# Patient Record
Sex: Female | Born: 1995 | Hispanic: Yes | Marital: Single | Smoking: Never smoker
Health system: Southern US, Community
[De-identification: ages and names within clinical notes are randomized; demographics above are authoritative.]

## PROBLEM LIST (undated history)

## (undated) ENCOUNTER — Inpatient Hospital Stay: Payer: Self-pay

## (undated) DIAGNOSIS — F419 Anxiety disorder, unspecified: Secondary | ICD-10-CM

## (undated) DIAGNOSIS — F32A Depression, unspecified: Secondary | ICD-10-CM

## (undated) DIAGNOSIS — F329 Major depressive disorder, single episode, unspecified: Secondary | ICD-10-CM

## (undated) DIAGNOSIS — D649 Anemia, unspecified: Secondary | ICD-10-CM

## (undated) DIAGNOSIS — O139 Gestational [pregnancy-induced] hypertension without significant proteinuria, unspecified trimester: Secondary | ICD-10-CM

## (undated) HISTORY — PX: OTHER SURGICAL HISTORY: SHX169

---
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
## 2017-03-19 ENCOUNTER — Other Ambulatory Visit: Payer: Self-pay | Admitting: Advanced Practice Midwife

## 2017-03-19 DIAGNOSIS — Z3481 Encounter for supervision of other normal pregnancy, first trimester: Secondary | ICD-10-CM

## 2017-03-19 LAB — OB RESULTS CONSOLE HIV ANTIBODY (ROUTINE TESTING): HIV: NONREACTIVE

## 2017-03-20 LAB — OB RESULTS CONSOLE HEPATITIS B SURFACE ANTIGEN: Hepatitis B Surface Ag: NEGATIVE

## 2017-03-20 LAB — OB RESULTS CONSOLE RPR: RPR: NONREACTIVE

## 2017-03-21 LAB — OB RESULTS CONSOLE GC/CHLAMYDIA
CHLAMYDIA, DNA PROBE: NEGATIVE
GC PROBE AMP, GENITAL: NEGATIVE

## 2017-03-25 ENCOUNTER — Ambulatory Visit
Admission: RE | Admit: 2017-03-25 | Discharge: 2017-03-25 | Disposition: A | Discharge status: Home / Self Care | Payer: Commercial Managed Care - PPO | Source: Ambulatory Visit | Attending: Advanced Practice Midwife | Admitting: Advanced Practice Midwife

## 2017-03-29 ENCOUNTER — Ambulatory Visit
Admission: RE | Admit: 2017-03-29 | Discharge: 2017-03-29 | Disposition: A | Discharge status: Home / Self Care | Payer: Medicaid Other | Source: Ambulatory Visit | Attending: Advanced Practice Midwife | Admitting: Advanced Practice Midwife

## 2017-03-29 ENCOUNTER — Other Ambulatory Visit: Payer: Self-pay | Admitting: Advanced Practice Midwife

## 2017-03-29 ENCOUNTER — Encounter: Payer: Self-pay | Admitting: Radiology

## 2017-03-29 DIAGNOSIS — Z3481 Encounter for supervision of other normal pregnancy, first trimester: Secondary | ICD-10-CM

## 2017-03-29 DIAGNOSIS — Z3A11 11 weeks gestation of pregnancy: Secondary | ICD-10-CM | POA: Insufficient documentation

## 2017-04-16 NOTE — L&D Delivery Note (Signed)
Date of delivery: 10/19/17 Estimated Date of Delivery: 10/18/17 Patient's last menstrual period was 01/06/2017. EGA: 8447w1d  Delivery Note At 8:13 AM a viable female was delivered via Vaginal, Spontaneous (Presentation: cephalic; direct OA).  APGAR: 7, 8; weight pending.   Placenta status: spontneous, intact.  Cord: 3vv  with the following complications: tight nuchal cord, cut at neck at perineum.  Cord pH: arterial 7.14.  Anesthesia:  epidural Episiotomy: None Lacerations: None Suture Repair: none Est. Blood Loss (mL): 185cc measured  Mom presented to L&D with labor.  epidual placed. Category 2 tracing with variable. Progressed to complete, second stage: short with deep variables delivery of fetal head with restitution to ROA.   Tight and blanched nuchal cord encountered, unable to be loosened.  Two kelly clamps placed and cord cut to facilitate delivery.  Anterior then posterior shoulders delivered without difficulty.  Baby placed on mom's chest, and attended to immediately by peds. Cord gas and cord blood collected.  Placenta spontaneously delivered, intact.   IV pitocin given for hemorrhage prophylaxis. No lacerations.  We sang happy birthday to baby Leoneo (pronounced Leo-now).  Mom to postpartum.  Baby to Couplet care / Skin to Skin.  Bernerd Terhune C Iran Rowe 10/19/2017, 9:37 AM

## 2017-05-23 ENCOUNTER — Other Ambulatory Visit: Payer: Self-pay | Admitting: Physician Assistant

## 2017-05-23 DIAGNOSIS — Z3482 Encounter for supervision of other normal pregnancy, second trimester: Secondary | ICD-10-CM

## 2017-05-31 ENCOUNTER — Ambulatory Visit
Admission: RE | Admit: 2017-05-31 | Discharge: 2017-05-31 | Disposition: A | Discharge status: Home / Self Care | Payer: Self-pay | Source: Ambulatory Visit | Attending: Physician Assistant | Admitting: Physician Assistant

## 2017-05-31 DIAGNOSIS — Z3482 Encounter for supervision of other normal pregnancy, second trimester: Secondary | ICD-10-CM | POA: Insufficient documentation

## 2017-05-31 DIAGNOSIS — Z3A20 20 weeks gestation of pregnancy: Secondary | ICD-10-CM | POA: Insufficient documentation

## 2017-07-19 LAB — HM HIV SCREENING LAB: HM HIV Screening: NEGATIVE

## 2017-07-19 LAB — OB RESULTS CONSOLE HIV ANTIBODY (ROUTINE TESTING): HIV: NONREACTIVE

## 2017-07-20 LAB — OB RESULTS CONSOLE RPR: RPR: NONREACTIVE

## 2017-09-22 LAB — OB RESULTS CONSOLE GBS: GBS: NEGATIVE

## 2017-09-23 LAB — OB RESULTS CONSOLE GC/CHLAMYDIA
CHLAMYDIA, DNA PROBE: NEGATIVE
GC PROBE AMP, GENITAL: NEGATIVE

## 2017-10-18 ENCOUNTER — Other Ambulatory Visit: Payer: Self-pay

## 2017-10-18 ENCOUNTER — Inpatient Hospital Stay
Admission: EM | Admit: 2017-10-18 | Discharge: 2017-10-20 | DRG: 807 | Disposition: A | Discharge status: Home / Self Care | Payer: Medicaid Other | Attending: Obstetrics & Gynecology | Admitting: Obstetrics & Gynecology

## 2017-10-18 DIAGNOSIS — D509 Iron deficiency anemia, unspecified: Secondary | ICD-10-CM | POA: Diagnosis present

## 2017-10-18 DIAGNOSIS — O9902 Anemia complicating childbirth: Principal | ICD-10-CM | POA: Diagnosis present

## 2017-10-18 DIAGNOSIS — Z3A40 40 weeks gestation of pregnancy: Secondary | ICD-10-CM

## 2017-10-18 HISTORY — DX: Major depressive disorder, single episode, unspecified: F32.9

## 2017-10-18 HISTORY — DX: Gestational (pregnancy-induced) hypertension without significant proteinuria, unspecified trimester: O13.9

## 2017-10-18 HISTORY — DX: Anxiety disorder, unspecified: F41.9

## 2017-10-18 HISTORY — DX: Depression, unspecified: F32.A

## 2017-10-18 NOTE — Progress Notes (Signed)
Pt states she was having abdominal pain that was far apart since 11a, as day progressed the pain got closer. Rates pain 9/10 prior to coming to hospital. Says she has been leaking fluid, clear since this morning at 11a, clear. [facility], cervix 1cm. Scheduled to return on next Wed for OB appt. GBS neg.

## 2017-10-19 ENCOUNTER — Inpatient Hospital Stay: Payer: Medicaid Other | Admitting: Anesthesiology

## 2017-10-19 ENCOUNTER — Encounter: Payer: Self-pay | Admitting: Anesthesiology

## 2017-10-19 DIAGNOSIS — Z3A40 40 weeks gestation of pregnancy: Secondary | ICD-10-CM | POA: Diagnosis not present

## 2017-10-19 DIAGNOSIS — D509 Iron deficiency anemia, unspecified: Secondary | ICD-10-CM | POA: Diagnosis present

## 2017-10-19 DIAGNOSIS — Z3483 Encounter for supervision of other normal pregnancy, third trimester: Secondary | ICD-10-CM | POA: Diagnosis present

## 2017-10-19 DIAGNOSIS — O9902 Anemia complicating childbirth: Secondary | ICD-10-CM | POA: Diagnosis present

## 2017-10-19 LAB — TYPE AND SCREEN
ABO/RH(D): O POS
ANTIBODY SCREEN: NEGATIVE

## 2017-10-19 LAB — CBC
HEMATOCRIT: 32.4 % — AB (ref 35.0–47.0)
HEMOGLOBIN: 11.3 g/dL — AB (ref 12.0–16.0)
MCH: 29.2 pg (ref 26.0–34.0)
MCHC: 34.9 g/dL (ref 32.0–36.0)
MCV: 83.8 fL (ref 80.0–100.0)
Platelets: 257 10*3/uL (ref 150–440)
RBC: 3.87 MIL/uL (ref 3.80–5.20)
RDW: 18.2 % — AB (ref 11.5–14.5)
WBC: 10.3 10*3/uL (ref 3.6–11.0)

## 2017-10-19 MED ORDER — IBUPROFEN 600 MG PO TABS
600.0000 mg | ORAL_TABLET | Freq: Four times a day (QID) | ORAL | Status: DC
  Administered 2017-10-19 – 2017-10-20 (×4): 600 mg via ORAL
  Filled 2017-10-19 (×4): qty 1

## 2017-10-19 MED ORDER — ACETAMINOPHEN 500 MG PO TABS
1000.0000 mg | ORAL_TABLET | Freq: Four times a day (QID) | ORAL | Status: DC | PRN
  Administered 2017-10-19: 1000 mg via ORAL

## 2017-10-19 MED ORDER — COCONUT OIL OIL
1.0000 "application " | TOPICAL_OIL | Status: DC | PRN
  Administered 2017-10-19: 1 via TOPICAL
  Filled 2017-10-19: qty 120

## 2017-10-19 MED ORDER — BUTORPHANOL TARTRATE 1 MG/ML IJ SOLN
1.0000 mg | INTRAMUSCULAR | Status: DC | PRN
  Administered 2017-10-19: 1 mg via INTRAVENOUS
  Filled 2017-10-19: qty 1

## 2017-10-19 MED ORDER — PHENYLEPHRINE 40 MCG/ML (10ML) SYRINGE FOR IV PUSH (FOR BLOOD PRESSURE SUPPORT)
80.0000 ug | PREFILLED_SYRINGE | INTRAVENOUS | Status: DC | PRN
  Filled 2017-10-19: qty 5

## 2017-10-19 MED ORDER — OXYTOCIN 40 UNITS IN LACTATED RINGERS INFUSION - SIMPLE MED
2.5000 [IU]/h | INTRAVENOUS | Status: DC
  Filled 2017-10-19: qty 1000

## 2017-10-19 MED ORDER — BUPIVACAINE HCL (PF) 0.25 % IJ SOLN
INTRAMUSCULAR | Status: DC | PRN
  Administered 2017-10-19: 5 mL via EPIDURAL

## 2017-10-19 MED ORDER — LACTATED RINGERS IV SOLN
INTRAVENOUS | Status: DC
  Administered 2017-10-19 (×2): via INTRAVENOUS

## 2017-10-19 MED ORDER — PRENATAL MULTIVITAMIN CH
1.0000 | ORAL_TABLET | Freq: Every day | ORAL | Status: DC
  Administered 2017-10-20: 1 via ORAL
  Filled 2017-10-19: qty 1

## 2017-10-19 MED ORDER — OXYTOCIN BOLUS FROM INFUSION
500.0000 mL | Freq: Once | INTRAVENOUS | Status: AC
  Administered 2017-10-19: 500 mL via INTRAVENOUS

## 2017-10-19 MED ORDER — WITCH HAZEL-GLYCERIN EX PADS: 1.0000 "application " | MEDICATED_PAD | CUTANEOUS | Status: DC

## 2017-10-19 MED ORDER — FENTANYL 2.5 MCG/ML W/ROPIVACAINE 0.15% IN NS 100 ML EPIDURAL (ARMC)
12.0000 mL/h | EPIDURAL | Status: DC
  Administered 2017-10-19: 12 mL/h via EPIDURAL

## 2017-10-19 MED ORDER — SIMETHICONE 80 MG PO CHEW: 80.0000 mg | CHEWABLE_TABLET | ORAL | Status: DC | PRN

## 2017-10-19 MED ORDER — EPHEDRINE 5 MG/ML INJ
10.0000 mg | INTRAVENOUS | Status: DC | PRN
  Filled 2017-10-19: qty 2

## 2017-10-19 MED ORDER — ACETAMINOPHEN 500 MG PO TABS
1000.0000 mg | ORAL_TABLET | Freq: Four times a day (QID) | ORAL | Status: DC | PRN
  Filled 2017-10-19: qty 2

## 2017-10-19 MED ORDER — FENTANYL 2.5 MCG/ML W/ROPIVACAINE 0.15% IN NS 100 ML EPIDURAL (ARMC)
EPIDURAL | Status: AC
  Filled 2017-10-19: qty 100

## 2017-10-19 MED ORDER — DIBUCAINE 1 % RE OINT: 1.0000 "application " | TOPICAL_OINTMENT | RECTAL | Status: DC | PRN

## 2017-10-19 MED ORDER — ONDANSETRON HCL 4 MG/2ML IJ SOLN: 4.0000 mg | Freq: Four times a day (QID) | INTRAMUSCULAR | Status: DC | PRN

## 2017-10-19 MED ORDER — DOCUSATE SODIUM 100 MG PO CAPS
100.0000 mg | ORAL_CAPSULE | Freq: Two times a day (BID) | ORAL | Status: DC
  Administered 2017-10-19 – 2017-10-20 (×2): 100 mg via ORAL
  Filled 2017-10-19 (×2): qty 1

## 2017-10-19 MED ORDER — DIPHENHYDRAMINE HCL 50 MG/ML IJ SOLN: 12.5000 mg | INTRAMUSCULAR | Status: DC | PRN

## 2017-10-19 MED ORDER — LACTATED RINGERS IV SOLN: 500.0000 mL | INTRAVENOUS | Status: DC | PRN

## 2017-10-19 MED ORDER — BENZOCAINE-MENTHOL 20-0.5 % EX AERO: 1.0000 "application " | INHALATION_SPRAY | CUTANEOUS | Status: DC | PRN

## 2017-10-19 MED ORDER — ONDANSETRON HCL 4 MG/2ML IJ SOLN: 4.0000 mg | INTRAMUSCULAR | Status: DC | PRN

## 2017-10-19 MED ORDER — LACTATED RINGERS IV SOLN: 500.0000 mL | Freq: Once | INTRAVENOUS | Status: DC

## 2017-10-19 MED ORDER — LIDOCAINE HCL (PF) 1 % IJ SOLN: 30.0000 mL | INTRAMUSCULAR | Status: DC | PRN

## 2017-10-19 MED ORDER — SOD CITRATE-CITRIC ACID 500-334 MG/5ML PO SOLN: 30.0000 mL | ORAL | Status: DC | PRN

## 2017-10-19 MED ORDER — DIPHENHYDRAMINE HCL 25 MG PO CAPS: 25.0000 mg | ORAL_CAPSULE | Freq: Four times a day (QID) | ORAL | Status: DC | PRN

## 2017-10-19 MED ORDER — ONDANSETRON HCL 4 MG PO TABS: 4.0000 mg | ORAL_TABLET | ORAL | Status: DC | PRN

## 2017-10-19 NOTE — Plan of Care (Signed)
Patient delivered female infant at [id number].

## 2017-10-19 NOTE — Anesthesia Procedure Notes (Signed)
Epidural Patient location during procedure: OB  Staffing Anesthesiologist: Serayah Yazdani, MD Performed: anesthesiologist   Preanesthetic Checklist Completed: patient identified, site marked, surgical consent, pre-op evaluation, timeout performed, IV checked, risks and benefits discussed and monitors and equipment checked  Epidural Patient position: sitting Prep: ChloraPrep Patient monitoring: heart rate, continuous pulse ox and blood pressure Approach: midline Location: L4-L5 Injection technique: LOR saline  Needle:  Needle type: Tuohy  Needle gauge: 18 G Needle length: 9 cm and 9 Catheter type: closed end flexible Catheter size: 20 Guage Test dose: negative and 1.5% lidocaine with Epi 1:200 K  Assessment Sensory level: T10 Events: blood not aspirated, injection not painful, no injection resistance, negative IV test and no paresthesia  Additional Notes   Patient tolerated the insertion well without complications.Reason for block:procedure for pain     

## 2017-10-19 NOTE — H&P (Signed)
OB History & Physical   History of Present Illness:  Chief Complaint:   HPI:  Julie Watkins is a 22 y.o. G36P0102 female at [redacted]w[redacted]d dated by  Patient's last menstrual period was 01/06/2017. Estimated Date of Delivery: 10/18/17.  Dates were adjusted based on 11w [place] by 2d.    She presents to L&D with contractions, found to be 3/60/-2, and after a period of observation in triage made cervical change to 4/70/-2.     +FM, + CTX, no LOF, no VB  Pregnancy Issues: 1. History of 34wk induction for mono-di twins with IUGR 2. Close interval pregnancy (<10mos) 3. Iron Def. Anemia  4. History of self mutilation (cutter) 5. History of depression/anxiety  6. Family history of Down syndrome (sister and aunt) 7. History of postpartum severe preeclampsia following delivery    Maternal Medical History:   Past Medical History:  Diagnosis Date  . Anemia   . Anxiety   . Depression   . Pregnancy induced hypertension     Past Surgical History:  Procedure Laterality Date  . none      Allergies  Allergen Reactions  . Shrimp [Shellfish Allergy] Swelling    Prior to Admission medications   Medication Sig Start Date End Date Taking? Authorizing Provider  acetaminophen (TYLENOL) 325 MG tablet Take 2 tablets (650 mg total) by mouth every 4 (four) hours as needed (for pain scale < 4). 08/12/16  Yes Verdis Koval, [name], MD  ferrous sulfate 325 (65 FE) MG tablet Take 325 mg by mouth daily with breakfast.   Yes [provider]  Prenatal Vit-Fe Fumarate-FA (PRENATAL MULTIVITAMIN) TABS tablet Take 1 tablet by mouth daily at 12 noon.   Yes [provider]  ibuprofen (ADVIL,MOTRIN) 600 MG tablet Take 1 tablet (600 mg total) by mouth every 6 (six) hours as needed. Patient not taking: Reported on 10/18/2017 08/12/16   Bayler Nehring, [name], MD     Prenatal care site: [facility]   Social History: She  reports that she has never smoked. She has never used smokeless tobacco. She  reports that she does not drink alcohol or use drugs.  Family History: family history includes Down syndrome in her sister.   Review of Systems: A full review of systems was performed and negative except as noted in the HPI.     Physical Exam:  Vital Signs: BP 118/68   Pulse 95   Temp 98.4 F (36.9 C) (Oral)   Resp 20   Ht 5\' 5"  (1.651 m)   Wt 74.4 kg (164 lb)   LMP 01/06/2017   SpO2 100%   BMI 27.29 kg/m  General: no acute distress.  HEENT: normocephalic, atraumatic Heart: regular rate & rhythm.  No murmurs/rubs/gallops Lungs: clear to auscultation bilaterally, normal respiratory effort Abdomen: soft, gravid, non-tender;  EFW: 6.12 Pelvic:   External: Normal external female genitalia  Cervix: 4/70/-2  Extremities: non-tender, symmetric, mild edema bilaterally.  DTRs: 2+  Neurologic: Alert & oriented x 3.    Results for orders placed or performed during the hospital encounter of 10/18/17 (from the past 24 hour(s))  CBC     Status: Abnormal   Collection Time: 10/19/17  2:38 AM  Result Value Ref Range   WBC 10.3 3.6 - 11.0 K/uL   RBC 3.87 3.80 - 5.20 MIL/uL   Hemoglobin 11.3 (L) 12.0 - 16.0 g/dL   HCT 16.1 (L) 09.6 - 04.5 %   MCV 83.8 80.0 - 100.0 fL   MCH  29.2 26.0 - 34.0 pg   MCHC 34.9 32.0 - 36.0 g/dL   RDW 40.918.2 (H) 81.111.5 - 91.414.5 %   Platelets 257 150 - 440 K/uL  Type and screen [facility]     Status: None   Collection Time: 10/19/17  2:38 AM  Result Value Ref Range   ABO/RH(D) O POS    Antibody Screen NEG    Sample Expiration      10/22/2017 Performed at [facility], [address] [place], [place] [address]     Pertinent Results:  Prenatal Labs: Blood type/Rh O+  Antibody screen neg  Rubella Immune  Varicella Immune  RPR NR  HBsAg Neg  HIV NR  GC neg  Chlamydia neg  Genetic screening negative  1 hour GTT 62  3 hour GTT   GBS neg   FHT: 135 mod + accels no decels TOCO:q8 min SVE:  4/70/-2 per RN Cephalic by  leopolds   Assessment:  Julie Watkins is a 22 y.o. 312P0102 female at 5873w1d with labor   Plan:  1. Admit to Labor & Delivery 2. CBC, T&S, Clrs, IVF 3. GBS  neg 4. Consents obtained. 5. Continuous efm/toco 6. Expectant management 7. Epidural ok-platelets ok 8. Watch BPs- has hx of PIH.    ----- Ranae Plumberhelsea Tyrees Chopin, MD Attending Obstetrician and Gynecologist [facility], Department of OB/GYN [facility]

## 2017-10-19 NOTE — Progress Notes (Signed)
Intrapartum progress note:  S: comfortable after epidural  O: BP 118/68   Pulse 95   Temp 98.4 F (36.9 C) (Oral)   Resp 20   Ht 5\' 5"  (1.651 m)   Wt 74.4 kg (164 lb)   LMP 01/06/2017   SpO2 100%   BMI 27.29 kg/m   SVE: 8cm then 7cm after AROM for clear fluid TOCO: q2-3  FHT: 130 mod +accels +occasional variables  A/P: 22yo G2P1002 @ 40+1 with labor  1. IUP: category 2, reassuring with mod variability and accelerations.   2. Labor: proceeding with expectant management after AROM.  anticipate vaginal delivery.    ----- Ranae Plumberhelsea Ward, MD Attending Obstetrician and Gynecologist [facility], Department of OB/GYN [facility]

## 2017-10-19 NOTE — Progress Notes (Signed)
RN in room to see pt and provide water to po hydrate. Pt back in bed from restroom, lying on Lt side with lights in room dimmed, agrees contractions have spaced out and rates pain at present 4/10. Pt denies needing any medication for pain at this time.

## 2017-10-19 NOTE — Progress Notes (Signed)
Dr Elesa MassedWard at bedside to evaluate pt and explained plan for AROM.

## 2017-10-19 NOTE — Progress Notes (Signed)
Repeat SVE completed, cervix now 4cm. Pt reports worsening pain with contractions although still spaced out. FOB, Simon[place]Leo [place].

## 2017-10-19 NOTE — Anesthesia Preprocedure Evaluation (Signed)
Anesthesia Evaluation  Patient identified by MRN, date of birth, ID band Patient awake    Reviewed: Allergy & Precautions, NPO status , Patient's Chart, lab work & pertinent test results, reviewed documented beta blocker date and time   Airway Mallampati: II  TM Distance: >3 FB     Dental  (+) Chipped   Pulmonary           Cardiovascular      Neuro/Psych PSYCHIATRIC DISORDERS Depression    GI/Hepatic   Endo/Other    Renal/GU      Musculoskeletal   Abdominal   Peds  Hematology  (+) anemia ,   Anesthesia Other Findings   Reproductive/Obstetrics                             Anesthesia Physical Anesthesia Plan  ASA: II  Anesthesia Plan: Epidural   Post-op Pain Management:    Induction:   PONV Risk Score and Plan:   Airway Management Planned:   Additional Equipment:   Intra-op Plan:   Post-operative Plan:   Informed Consent: I have reviewed the patients History and Physical, chart, labs and discussed the procedure including the risks, benefits and alternatives for the proposed anesthesia with the patient or authorized representative who has indicated his/her understanding and acceptance.     Plan Discussed with: CRNA  Anesthesia Plan Comments:         Anesthesia Quick Evaluation

## 2017-10-19 NOTE — Lactation Note (Addendum)
This note was copied from a baby's chart. Lactation Consultation Note  Patient Name: Julie Watkins ZOXWR'UToday's Date: 10/19/2017 Reason for consult: Term Mom was having difficulty waking Julie Watkins to breast feed.  Demonstrated waking techniques.  Changed meconium.  Mom concerned about having enough milk and asking about pumping.  Demonstrated hand expression of lots of colostrum.  Discouraged pumping and bottle feeding explaining risks to milk supply and continued successful breast feeding.  Once awake and he was positioned in cradle hold skin to skin with pillow support, he began strong rhythmic sucking with occasional swallows.  Mom reports being unable to breast feed twins before d/t starting out in NICU on bottles.  Kept trying for a month without success.  Reviewed supply and demand, routine newborn feeding patterns and normal course of lactation.  Lactation name and number written on white board and encouraged to call with any questions, concerns or assistance.  Maternal Data Formula Feeding for Exclusion: No Has patient been taught Hand Expression?: Yes Does the patient have breastfeeding experience prior to this delivery?: Yes  Feeding Feeding Type: Breast Fed Length of feed: 20 min  LATCH Score Latch: Repeated attempts needed to sustain latch, nipple held in mouth throughout feeding, stimulation needed to elicit sucking reflex.  Audible Swallowing: A few with stimulation  Type of Nipple: Everted at rest and after stimulation  Comfort (Breast/Nipple): Soft / non-tender  Hold (Positioning): Assistance needed to correctly position infant at breast and maintain latch.  LATCH Score: 7  Interventions Interventions: Breast feeding basics reviewed;Reverse pressure;Assisted with latch;Breast compression;Adjust position;Breast massage;Support pillows;Hand express;Position options  Lactation Tools Discussed/Used     Consult Status Consult Status: Follow-up Date: 10/19/17 Follow-up  type: Call as needed    Julie MeckelWilliams, Ajmal Kathan Kay 10/19/2017, 2:47 PM

## 2017-10-19 NOTE — Discharge Summary (Signed)
Obstetrical Discharge Summary  Patient Name: Julie Watkins DOB: 03/21/1996 MRN: [id number]  Date of Admission: 10/18/2017 Date of Delivery: 10/19/17 Delivered by: Ranae Plumberhelsea Chet Greenley, MD Date of Discharge: 10/20/2017  Primary OB: Phineas Realharles Drew NWG:[id number]:Patient's last menstrual period was 01/06/2017. EDC Estimated Date of Delivery: 10/18/17 Gestational Age at Delivery: 5348w1d   Antepartum complications:   1. History of 34wk induction for mono-di twins with IUGR 2. Close interval pregnancy (<7018mos) 3. Iron Def. Anemia  4. History of self mutilation (cutter) 5. History of depression/anxiety  6. Family history of Down syndrome (sister and aunt) 7. History of postpartum severe preeclampsia following delivery    Admitting Diagnosis:  Labor at term Secondary Diagnosis: Patient Active Problem List   Diagnosis Date Noted  . Twin gestation, monochorionic/diamniotic (one placenta, two amniotic sacs) 08/09/2016  . Indication for care in labor and delivery, antepartum 08/05/2016  . Preterm labor 08/04/2016  . Labor and delivery indication for care or intervention 08/03/2016  . Twin pregnancy, antepartum mono di  03/15/2016  . Family history of Down syndrome 03/15/2016  . High risk teen pregnancy 03/15/2016  . Knee injury, sequela 03/15/2016  . MDD (major depressive disorder), single episode, severe (HCC) 09/23/2013  . Suicidal ideation 09/23/2013  . Overdose of analgesic or antipyretic 09/23/2013    Augmentation: AROM Complications: None Intrapartum complications/course: Mom presented to L&D with labor.  epidual placed. Category 2 tracing with variable. Progressed to complete, second stage: short with deep variables delivery of fetal head with restitution to ROA.   Tight and blanched nuchal cord encountered, unable to be loosened.  Two kelly clamps placed and cord cut to facilitate delivery.  Anterior then posterior shoulders delivered without difficulty.  Baby placed on mom's chest, and attended to  immediately by peds. Cord gas and cord blood collected.  Placenta spontaneously delivered, intact.   IV pitocin given for hemorrhage prophylaxis. No lacerations.  Date of Delivery: 10/19/17 Delivered By: Leeroy Bockhelsea Jethro Radke Delivery Type: spontaneous vaginal delivery Anesthesia: epidural Placenta: sponatneous Laceration: none Episiotomy: none Newborn Data: Live born female "[place]" Birth Weight:  3420g, 7lb 8oz APGAR: 7, 8  Newborn Delivery   Birth date/time:  10/19/2017 08:13:00 Delivery type:  Vaginal, Spontaneous     Postpartum Procedures: none  Post partum course:  Patient had an uncomplicated postpartum course.  By time of discharge on PPD#1, her pain was controlled on oral pain medications; she had appropriate lochia and was ambulating, voiding without difficulty and tolerating regular diet.  She was deemed stable for discharge to home.    Discharge Physical Exam:  BP 111/63 (BP Location: Right Arm)   Pulse 62   Temp 97.8 F (36.6 C) (Oral)   Resp 20   Ht 5\' 5"  (1.651 m)   Wt 74.4 kg (164 lb)   LMP 01/06/2017   SpO2 100%   Breastfeeding? Unknown   BMI 27.29 kg/m   General: NAD CV: RRR Pulm: CTABL, nl effort ABD: s/nd/nt, fundus firm and below the umbilicus Lochia: moderate DVT Evaluation: LE non-ttp, no evidence of DVT on exam.  Hemoglobin  Date Value Ref Range Status  10/20/2017 9.7 (L) 12.0 - 16.0 g/dL Final   HGB  Date Value Ref Range Status  09/21/2013 11.0 (L) 12.0 - 16.0 g/dL Final   HCT  Date Value Ref Range Status  10/20/2017 29.5 (L) 35.0 - 47.0 % Final  09/21/2013 36.1 35.0 - 47.0 % Final     Disposition: stable, discharge to home. Baby Feeding: breastmilk Baby Disposition: home with mom  Rh Immune globulin given: n/a Rubella vaccine given: n/a Tdap vaccine given in AP or PP setting: AP Flu vaccine given in AP or PP setting: AP  Contraception: depo then nexplanon  Prenatal Labs:   Blood type/Rh O+  Antibody screen neg  Rubella Immune   Varicella Immune  RPR NR  HBsAg Neg  HIV NR  GC neg  Chlamydia neg  Genetic screening negative  1 hour GTT 62  3 hour GTT   GBS neg       Plan:  Gwendlyn Hanback was discharged to home in good condition. Follow-up appointment with Dr. Elesa Massed in 6 weeks.  Discharge Medications: Allergies as of 10/20/2017      Reactions   Shrimp [shellfish Allergy] Swelling      Medication List    TAKE these medications   acetaminophen 325 MG tablet Commonly known as:  TYLENOL Take 2 tablets (650 mg total) by mouth every 4 (four) hours as needed (for pain scale < 4).   ferrous sulfate 325 (65 FE) MG tablet Take 325 mg by mouth daily with breakfast.   ibuprofen 600 MG tablet Commonly known as:  ADVIL,MOTRIN Take 1 tablet (600 mg total) by mouth every 6 (six) hours as needed.   prenatal multivitamin Tabs tablet Take 1 tablet by mouth daily at 12 noon.       Follow-up Information    Gittel Mccamish, [name], MD Follow up in 6 week(s).   Specialty:  Obstetrics and Gynecology Why:  4-6 weeks for routine postpartum check Contact information: [address] [place] [address] [phone]           Signed: ----- Ranae Plumber, MD Attending Obstetrician and Gynecologist [facility], Department of OB/GYN [facility]

## 2017-10-19 NOTE — Plan of Care (Signed)
Labor progression noted, pt now 9.5cm, bloody show, deep variable during contractions. O2 via non-rebreather on at 10L. Pt being repositioned often to improve tracing. Dr Elesa MassedWard provided update.

## 2017-10-20 LAB — CBC
HCT: 29.5 % — ABNORMAL LOW (ref 35.0–47.0)
HEMOGLOBIN: 9.7 g/dL — AB (ref 12.0–16.0)
MCH: 27.8 pg (ref 26.0–34.0)
MCHC: 32.8 g/dL (ref 32.0–36.0)
MCV: 84.7 fL (ref 80.0–100.0)
Platelets: 230 10*3/uL (ref 150–440)
RBC: 3.49 MIL/uL — AB (ref 3.80–5.20)
RDW: 18.9 % — ABNORMAL HIGH (ref 11.5–14.5)
WBC: 9.5 10*3/uL (ref 3.6–11.0)

## 2017-10-20 LAB — RPR: RPR Ser Ql: NONREACTIVE

## 2017-10-20 NOTE — Discharge Instructions (Signed)

## 2017-10-20 NOTE — Progress Notes (Signed)
Provided and reviewed discharge paperwork and prescriptions. Pt verbalized understanding. Pt to make follow up appointment within 4-6 weeks. Discharged home with infant via wheelchair to visitor entrance by hospital volunteer.

## 2017-10-20 NOTE — Anesthesia Postprocedure Evaluation (Signed)
Anesthesia Post Note  Patient: Julie Watkins  Procedure(s) Performed: AN AD HOC LABOR EPIDURAL  Patient location during evaluation: Mother Baby Anesthesia Type: Epidural Level of consciousness: awake and alert Pain management: pain level controlled Vital Signs Assessment: post-procedure vital signs reviewed and stable Respiratory status: spontaneous breathing, nonlabored ventilation and respiratory function stable Cardiovascular status: stable Postop Assessment: no headache, no backache, patient able to bend at knees and able to ambulate Anesthetic complications: no     Last Vitals:  Vitals:   10/20/17 0343 10/20/17 0740  BP: (!) 106/45 111/63  Pulse: 66 62  Resp: 18 20  Temp: 36.7 C 36.6 C  SpO2: 98% 100%    Last Pain:  Vitals:   10/20/17 0740  TempSrc: Oral  PainSc:                  Cleda MccreedyJoseph K Krew Hortman

## 2018-10-24 DIAGNOSIS — Z7289 Other problems related to lifestyle: Secondary | ICD-10-CM | POA: Insufficient documentation

## 2018-10-27 ENCOUNTER — Ambulatory Visit: Payer: Self-pay

## 2018-11-07 ENCOUNTER — Other Ambulatory Visit: Payer: Self-pay

## 2018-11-07 ENCOUNTER — Ambulatory Visit (LOCAL_COMMUNITY_HEALTH_CENTER): Payer: Self-pay

## 2018-11-07 VITALS — BP 105/72 | Ht 62.0 in | Wt 153.5 lb

## 2018-11-07 DIAGNOSIS — Z3042 Encounter for surveillance of injectable contraceptive: Secondary | ICD-10-CM

## 2018-11-07 DIAGNOSIS — Z3009 Encounter for other general counseling and advice on contraception: Secondary | ICD-10-CM

## 2018-11-07 DIAGNOSIS — Z30013 Encounter for initial prescription of injectable contraceptive: Secondary | ICD-10-CM

## 2018-11-07 MED ORDER — MULTI-VITAMIN/MINERALS PO TABS: 1.0000 | ORAL_TABLET | Freq: Every day | ORAL | 0 refills | Status: DC

## 2018-11-07 MED ORDER — MEDROXYPROGESTERONE ACETATE 150 MG/ML IM SUSP
150.0000 mg | Freq: Once | INTRAMUSCULAR | Status: AC
  Administered 2018-11-07: 150 mg via INTRAMUSCULAR

## 2018-11-07 NOTE — Progress Notes (Signed)
Client is 14 weeks and 1 day post last Depo injection on 07/31/2018. Intercourse without condom yesterday and took Plan B this am. Depo administered today without difficulty per 12/06/2017 written order of Jerline Pain FNP-BC. Client tolerated injection without difficulty. Folic acid counseling completed. Shona Needles, RN

## 2019-04-11 IMAGING — US US MFM FETAL BPP W/O NON-STRESS
1 series · 13 of 20 positions shown · non-contrast
Comparison: none

PATIENT INFO:

PERFORMED BY:
SERVICE(S) PROVIDED:
INDICATIONS:
32 weeks gestation of pregnancy
Twin gestation
Monodi
Fetal growth restriction
FETAL EVALUATION (FETUS A):
Num Of Fetuses:     2
Fetal Heart         153
Rate(bpm):
Cardiac Activity:   Present
Presentation:       Vertex
Placenta:           Anterior
Membrane Desc:      Monochorionic - diamniotic
Membrane Size:      Normal
Largest Pocket(cm)
2.0
BIOPHYSICAL EVALUATION (FETUS A):
Amniotic F.V:   Pocket => 2 cm two         F. Tone:         Observed
planes
F. Movement:    Observed                   Score:           [DATE]
F. Breathing:   Observed
GESTATIONAL AGE (FETUS A):
LMP:           32w 2d        Date:  12/13/15                 EDD:   09/18/16
Best:          32w 2d     Det. By:  LMP  (12/13/15)          EDD:   09/18/16
ANATOMY (FETUS A):
Stomach:               Seen                   Bladder:                Seen
FETAL EVALUATION (FETUS B):
Fetal Heart         149
Presentation:       Breech /oblique
6.9
BIOPHYSICAL EVALUATION (FETUS B):
GESTATIONAL AGE (FETUS B):
ANATOMY (FETUS B):

[Series 1: us mfm fetal bpp w/o non-stress · 0.26mm/px · 13 of 20 slices shown]
[im 1/20]
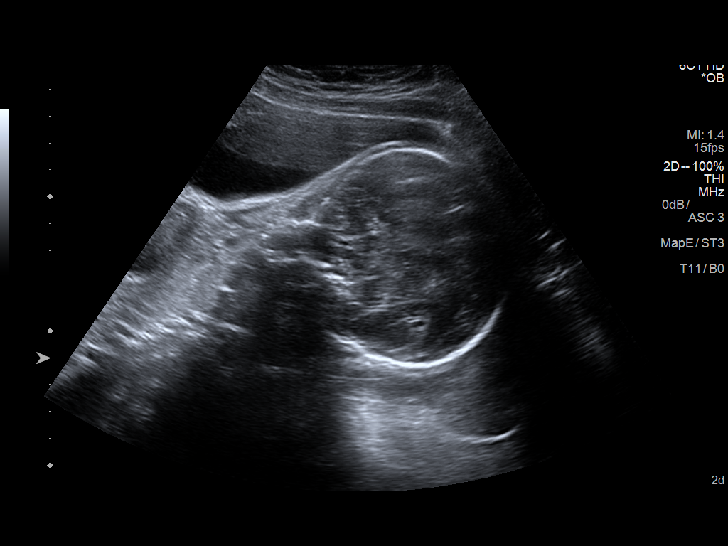
[im 3/20]
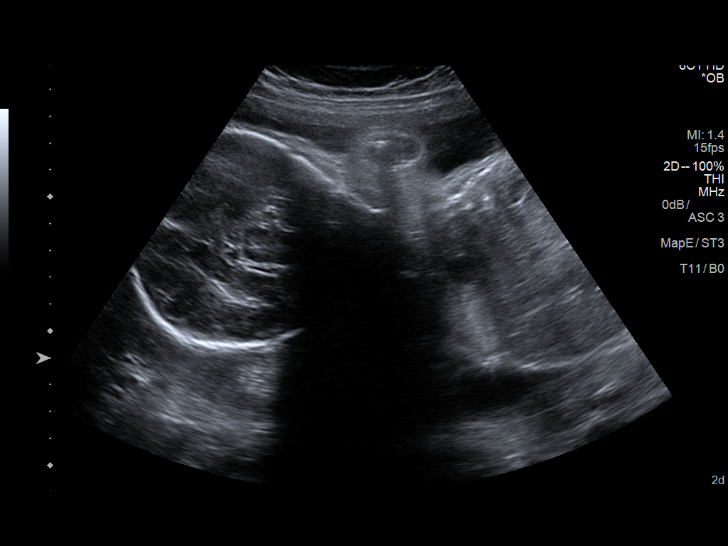
[im 4/20]
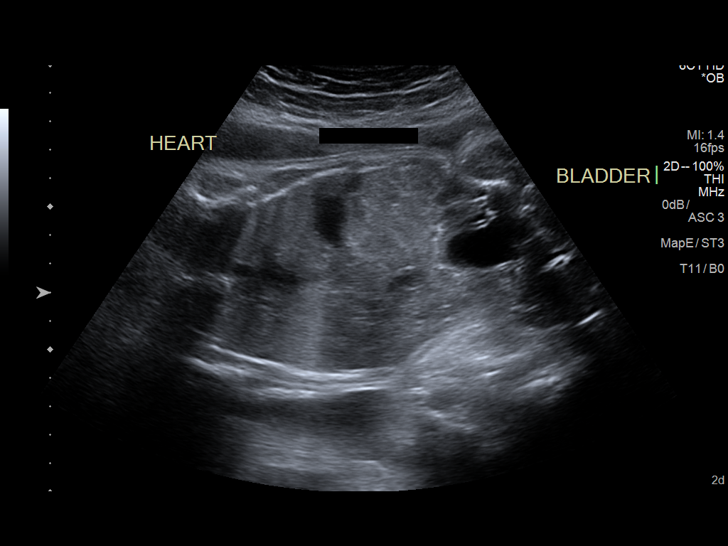
[im 6/20]
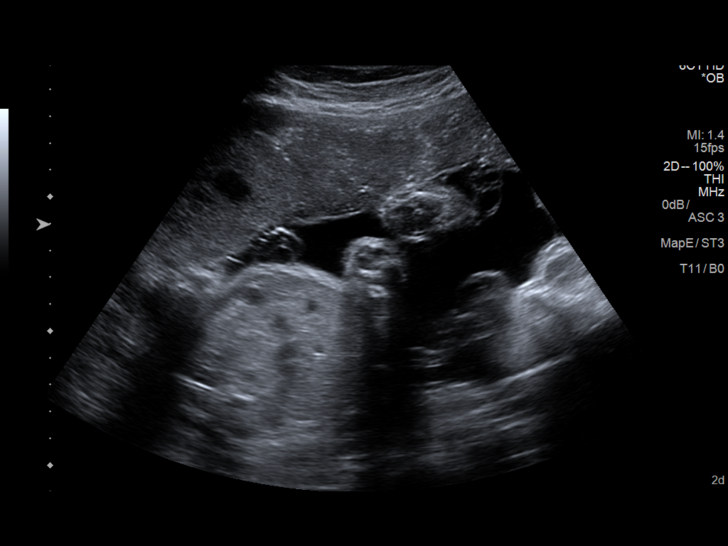
[im 7/20]
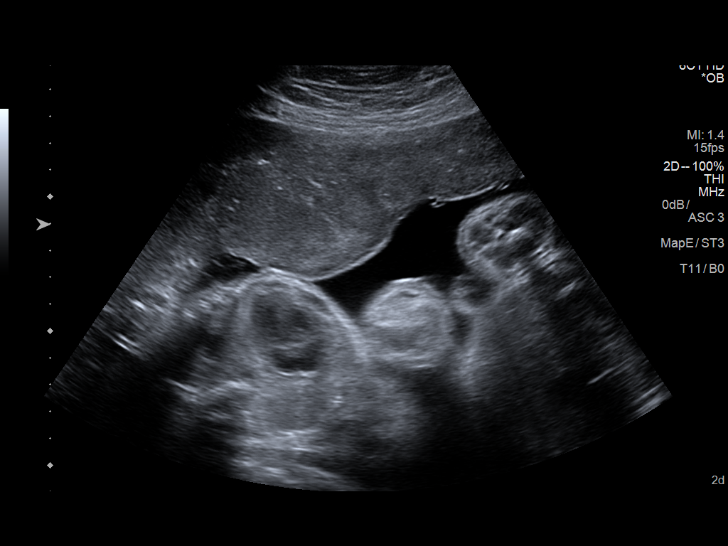
[im 9/20]
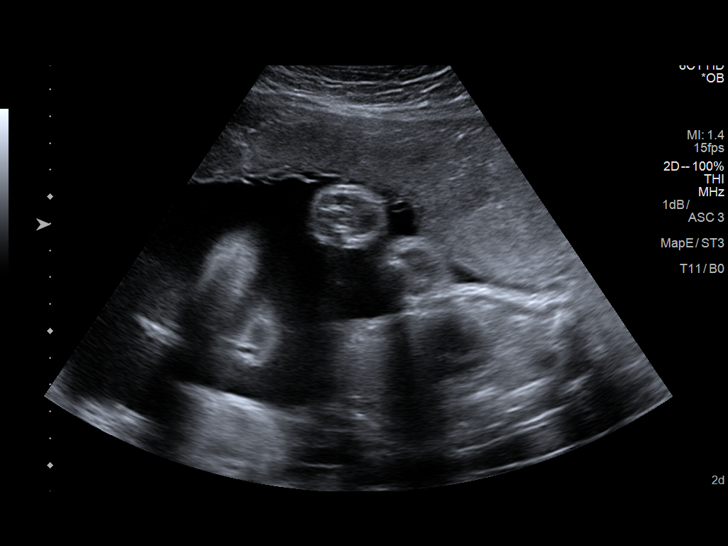
[im 11/20]
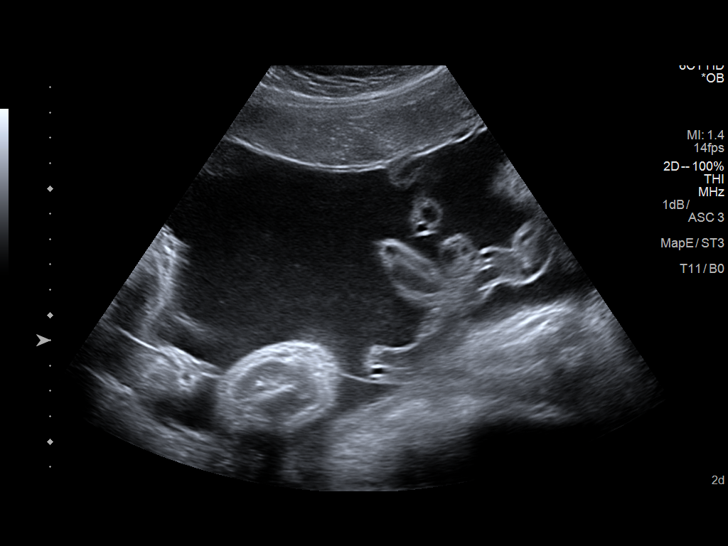
[im 12/20]
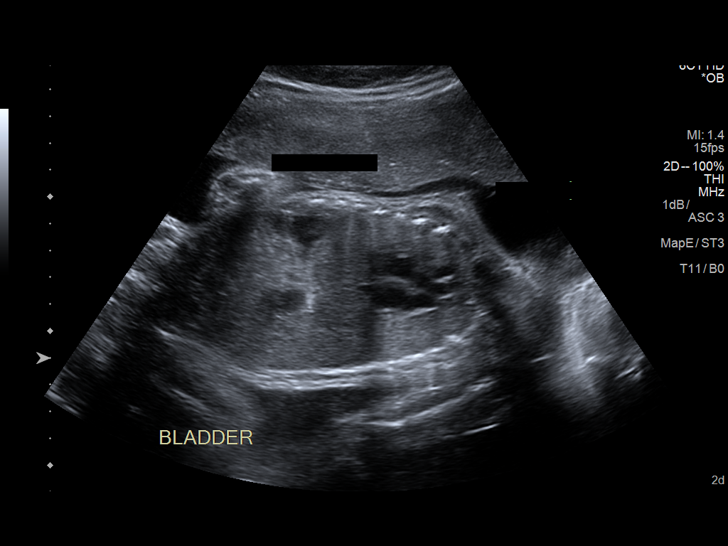
[im 14/20]
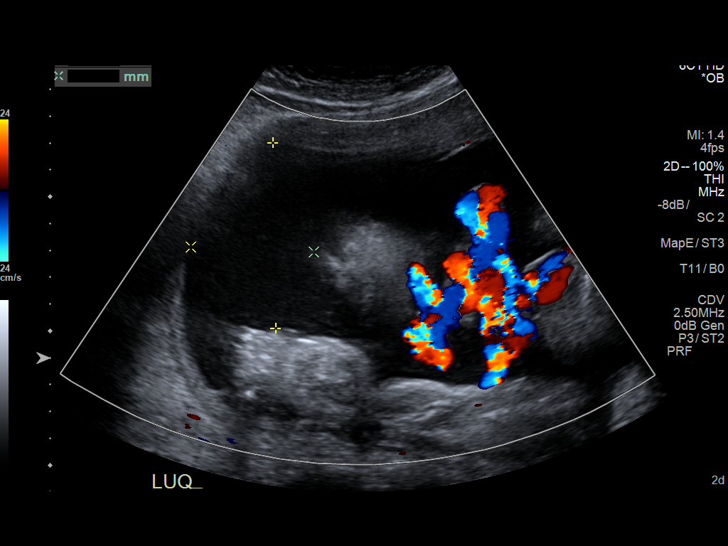
[im 15/20]
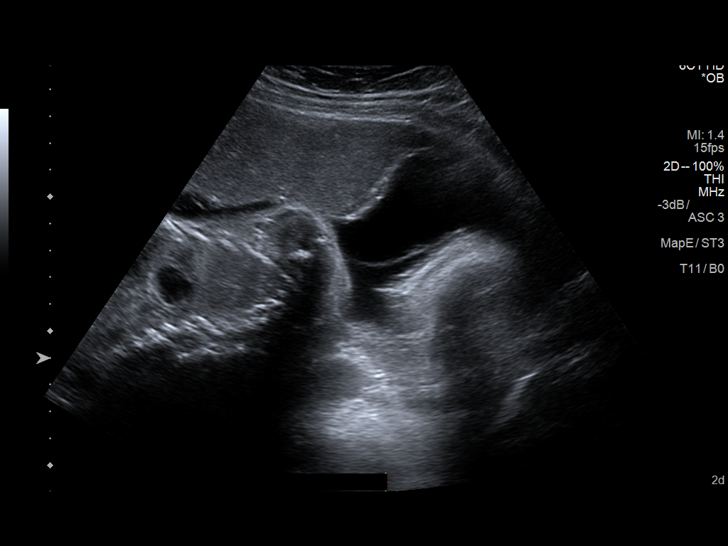
[im 17/20]
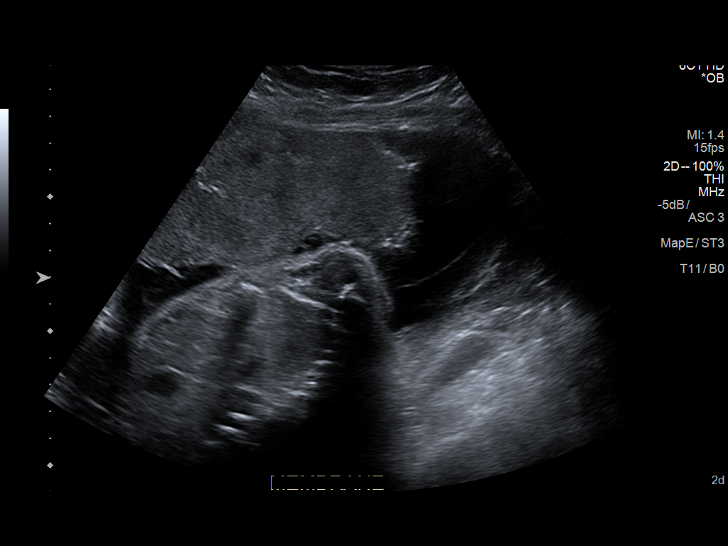
[im 18/20]
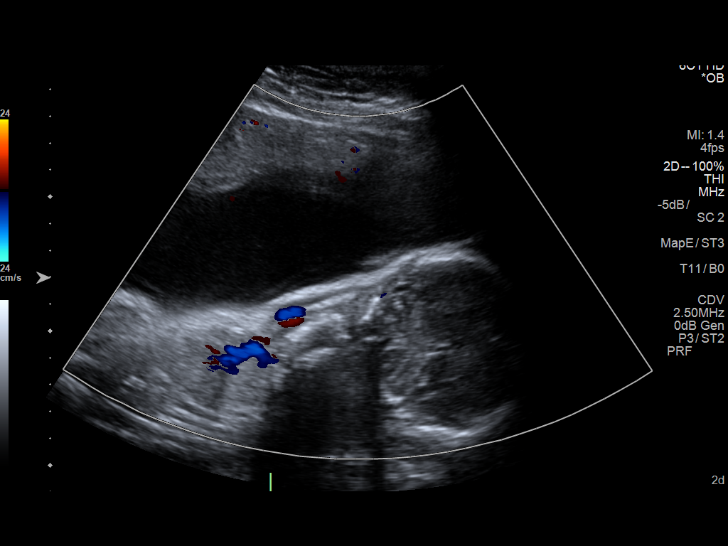
[im 20/20]
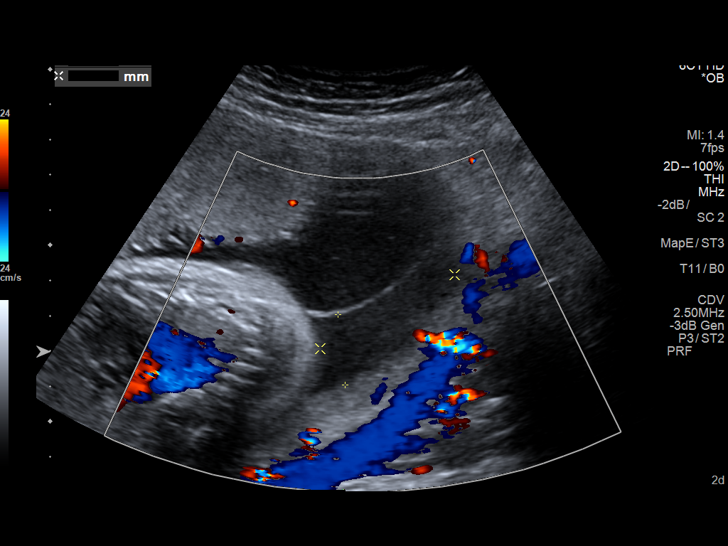

[13 of 20 positions shown; findings below may reference images not displayed]

IMPRESSION: Ms. Abimelk Tiger returns for fetal testing in setting of
monochorionic-diamniotic twin gestation complicated by fetal
growth restriction of each twin.  She reports good fetal
movement of each twin.   Ultrasound demonstrates a live
monochorionic diamniotic twin pregnancy at 32 [DATE] weeks.
Dating is by  LMP consistent with earliest available
ultrasound performed on 02/07/16 at [HOSPITAL] with
measurements of 8 [DATE] weeks.

Twin A: Cephalic, maternal right. MVP 2.0 (prior 2.6) with a
2.0 x 4.3 cm pocket present. Stomach and bladder visualized,
BPP [DATE]
Twin B: Breech, maternal left. MVP 6.9 (prior 5.2). Stomach
and bladder visualized. BPP [DATE]

Findings were discussed. Reviewed Dusan Duki Busenje Bunara/fetal
movement assessment.  Return on [REDACTED] for growth scan,
BPP and dopplers.   Delivery and fetal testing planning to be
made at that time.

## 2019-05-01 ENCOUNTER — Ambulatory Visit (LOCAL_COMMUNITY_HEALTH_CENTER): Payer: Self-pay

## 2019-05-01 ENCOUNTER — Other Ambulatory Visit: Payer: Self-pay

## 2019-05-01 ENCOUNTER — Encounter: Payer: Self-pay | Admitting: Family Medicine

## 2019-05-01 ENCOUNTER — Ambulatory Visit (LOCAL_COMMUNITY_HEALTH_CENTER): Payer: Self-pay | Admitting: Family Medicine

## 2019-05-01 ENCOUNTER — Ambulatory Visit: Payer: Self-pay

## 2019-05-01 VITALS — BP 112/68 | Ht 63.0 in | Wt 164.6 lb

## 2019-05-01 DIAGNOSIS — Z113 Encounter for screening for infections with a predominantly sexual mode of transmission: Secondary | ICD-10-CM

## 2019-05-01 DIAGNOSIS — F32A Depression, unspecified: Secondary | ICD-10-CM

## 2019-05-01 DIAGNOSIS — Z30013 Encounter for initial prescription of injectable contraceptive: Secondary | ICD-10-CM

## 2019-05-01 DIAGNOSIS — Z01419 Encounter for gynecological examination (general) (routine) without abnormal findings: Secondary | ICD-10-CM

## 2019-05-01 DIAGNOSIS — Z23 Encounter for immunization: Secondary | ICD-10-CM

## 2019-05-01 DIAGNOSIS — F329 Major depressive disorder, single episode, unspecified: Secondary | ICD-10-CM

## 2019-05-01 LAB — WET PREP FOR TRICH, YEAST, CLUE
Trichomonas Exam: NEGATIVE
Yeast Exam: NEGATIVE

## 2019-05-01 LAB — URINALYSIS
Bilirubin, UA: NEGATIVE
Glucose, UA: NEGATIVE
Ketones, UA: NEGATIVE
Leukocytes,UA: NEGATIVE
Nitrite, UA: NEGATIVE
Specific Gravity, UA: 1.03 (ref 1.005–1.030)
Urobilinogen, Ur: 1 mg/dL (ref 0.2–1.0)
pH, UA: 5.5 (ref 5.0–7.5)

## 2019-05-01 LAB — PREGNANCY, URINE: Preg Test, Ur: NEGATIVE

## 2019-05-01 MED ORDER — THERA VITAL M PO TABS: 1.0000 | ORAL_TABLET | Freq: Every day | ORAL | 3 refills | Status: DC

## 2019-05-01 MED ORDER — MEDROXYPROGESTERONE ACETATE 150 MG/ML IM SUSP
150.0000 mg | INTRAMUSCULAR | Status: AC
  Administered 2019-05-01 – 2019-10-23 (×3): 150 mg via INTRAMUSCULAR

## 2019-05-01 NOTE — Progress Notes (Signed)
In for physical and Depo; took Plan B on 04/24/19 and 04/26/19;desires HIV/RPR testing Sharlette Dense, RN Informed PT Neg and Wet prep reviewed-no Tx indicated; if s/s UTI continue will call [facility] Sharlette Dense, RN

## 2019-05-01 NOTE — Progress Notes (Signed)
Family Planning Visit- Initial Visit  Subjective:  Julie Watkins is a 24 y.o. being seen today for an initial well woman visit and to discuss family planning options.  Julie Watkins is currently using none for pregnancy prevention. Patient reports Julie Watkins does not want a pregnancy in the next year.  Patient has the following medical conditions has MDD (major depressive disorder), single episode, severe ([place]); Suicidal ideation; Overdose of analgesic or antipyretic; Knee injury, sequela; and Self-mutilation on their problem list.  Chief Complaint  Patient presents with  . Contraception    Patient reports here for physical and Depo, last used Depo in July. Last sex 5 days ago, took Plan B. Patient's last menstrual period was 04/29/2019 (exact date). - today.  Mood: has been feeling down, depressed since giving birth in 2019. Hx of being on celexa, though does not wish to restart medication right now.  Pap: no past records of pap   Body mass index is 29.16 kg/m. - Patient is eligible for diabetes screening based on BMI and age >74?  no HA1C ordered? not applicable  Patient reports 1 of partners in last year. Desires STI screening?  Yes  Does the patient have a current or past history of drug use? No   No components found for: HCV]   Health Maintenance Due  Topic Date Due  . CHLAMYDIA SCREENING  05/23/2012  . PAP-Cervical Cytology Screening  05/23/2018  . PAP SMEAR-Modifier  05/23/2018    ROS + for:  Migraines: has had these since starting Depo. Does not wish to switch BCM, though. Takes no medications for it. Wt gain: since giving birth Easy bruising: hx of anemia, taking no medications right now Freq urination: over past month, hasn't seen a doctor.   The following portions of the patient's history were reviewed and updated as appropriate: allergies, current medications, past family history, past medical history, past social history, past surgical history and problem list. Problem  list updated.   See flowsheet for other program required questions.  Objective:   Vitals:   05/01/19 1020  BP: 112/68  Weight: 164 lb 9.6 oz (74.7 kg)  Height: 5\' 3"  (1.6 m)    Physical Exam Vitals and nursing note reviewed.  Constitutional:      Appearance: Normal appearance.  HENT:     Head: Normocephalic and atraumatic.     Mouth/Throat:     Mouth: Mucous membranes are moist.     Pharynx: Oropharynx is clear. No oropharyngeal exudate or posterior oropharyngeal erythema.  Eyes:     Conjunctiva/sclera: Conjunctivae normal.  Neck:     Thyroid: No thyroid mass, thyromegaly or thyroid tenderness.  Cardiovascular:     Rate and Rhythm: Normal rate and regular rhythm.     Pulses: Normal pulses.     Heart sounds: Normal heart sounds.  Pulmonary:     Effort: Pulmonary effort is normal.     Breath sounds: Normal breath sounds.  Chest:     Breasts:        Right: Normal. No swelling, mass, nipple discharge, skin change or tenderness.        Left: Normal. No swelling, mass, nipple discharge, skin change or tenderness.  Abdominal:     General: Abdomen is flat.     Palpations: There is no mass.     Tenderness: There is no abdominal tenderness. There is no rebound.  Genitourinary:     General: Normal vulva.     Exam position: Lithotomy position.  Pubic Area: No rash or pubic lice.      Labia:        Right: No rash or lesion.        Left: No rash or lesion.      Vagina: Normal. No vaginal discharge, erythema, or lesions. +bleeding    Cervix: No cervical motion tenderness, discharge, friability, lesion or erythema.     Uterus: Normal.      Adnexa: Right adnexa normal and left adnexa normal.     Rectum: Normal.  Lymphadenopathy:     Head:     Right side of head: No preauricular or posterior auricular adenopathy.     Left side of head: No preauricular or posterior auricular adenopathy.     Cervical: No cervical adenopathy.     Upper Body:     Right upper body: No  supraclavicular or axillary adenopathy.     Left upper body: No supraclavicular or axillary adenopathy.     Lower Body: No right inguinal adenopathy. No left inguinal adenopathy.  Skin:    General: Skin is warm and dry.     Findings: No rash.  Neurological:     Mental Status: Julie Watkins is alert and oriented to person, place, and time.      Assessment and Plan:  Allisyn Kunz is a 24 y.o. female presenting to the [facility] Department for an initial well woman exam/family planning visit  Contraception counseling: Reviewed all forms of birth control options in the tiered based approach. available including abstinence; over the counter/barrier methods; hormonal contraceptive medication including pill, patch, ring, injection,contraceptive implant; hormonal and nonhormonal IUDs; permanent sterilization options including vasectomy and the various tubal sterilization modalities. Risks, benefits, and typical effectiveness rates were reviewed.  Questions were answered.  Written information was also given to the patient to review.  Patient desires depo, this was prescribed for patient. Julie Watkins will follow up in  3 months for surveillance.  Julie Watkins was told to call with any further questions, or with any concerns about this method of contraception.  Emphasized use of condoms 100% of the time for STI prevention.  ECP: n/a    1. Well woman exam -Complete physical today. -PCP handout given to f/u on chronic issues.  - Pap IG (Image Guided)  2. Screening examination for venereal disease -Screenings today as below. Treat wet prep per standing order. -Patient does not meet criteria for HepB, HepC Screening.  -Counseled on warning s/sx and when to seek care. Recommended condom use with all sex and discussed importance of condom use for STI prevention. - WET PREP FOR TRICH, YEAST, CLUE - Chlamydia/Gonorrhea [facility] - HIV [facility] - Syphilis Serology, [facility]  3. Encounter for initial  prescription of injectable contraceptive Rx depo x1 yr. Counseling as above.  - Urinalysis (Urine Dip) - negative - medroxyPROGESTERone (DEPO-PROVERA) injection 150 mg  4. Depression -On intake form pt endorses anxiety and depression. I offered [facility] referral or connection to therapist to restart medication Julie Watkins was on years ago. Julie Watkins declines both for now. No si/hi but to go to hospital if present.     Return in about 3 months (around 07/30/2019) for Depo.  No future appointments.  Ann Held, PA-C

## 2019-05-01 NOTE — Progress Notes (Signed)
See FP record; flu vaccine adm. L. delt-well tolerated Sharlette Dense, RN

## 2019-05-04 LAB — PAP IG (IMAGE GUIDED): PAP Smear Comment: 0

## 2019-08-07 ENCOUNTER — Ambulatory Visit (LOCAL_COMMUNITY_HEALTH_CENTER): Payer: Self-pay | Admitting: Advanced Practice Midwife

## 2019-08-07 ENCOUNTER — Other Ambulatory Visit: Payer: Self-pay

## 2019-08-07 VITALS — BP 114/80 | Ht 63.0 in | Wt 164.8 lb

## 2019-08-07 DIAGNOSIS — Z30013 Encounter for initial prescription of injectable contraceptive: Secondary | ICD-10-CM

## 2019-08-07 DIAGNOSIS — Z3009 Encounter for other general counseling and advice on contraception: Secondary | ICD-10-CM

## 2019-08-07 MED ORDER — MULTI-VITAMIN/MINERALS PO TABS: 1.0000 | ORAL_TABLET | Freq: Every day | ORAL | 0 refills | Status: AC

## 2019-08-07 NOTE — Progress Notes (Signed)
Presents for Depo only. Depo administered per 05/01/2019 written order of J. Staples PA-C and client tolerated without complaint. Folic acid counseling completed and encouraged MVI daily as client reports taking intermittently. Jossie Ng, RN

## 2019-08-10 ENCOUNTER — Telehealth: Payer: Self-pay

## 2019-08-13 NOTE — Telephone Encounter (Signed)
complete

## 2019-10-23 ENCOUNTER — Ambulatory Visit (LOCAL_COMMUNITY_HEALTH_CENTER): Payer: Self-pay

## 2019-10-23 ENCOUNTER — Other Ambulatory Visit: Payer: Self-pay

## 2019-10-23 VITALS — BP 119/71 | Ht 63.0 in | Wt 164.0 lb

## 2019-10-23 DIAGNOSIS — Z3009 Encounter for other general counseling and advice on contraception: Secondary | ICD-10-CM

## 2019-10-23 DIAGNOSIS — Z30013 Encounter for initial prescription of injectable contraceptive: Secondary | ICD-10-CM

## 2019-10-23 NOTE — Addendum Note (Signed)
Addended byHart Carwin on: 10/23/2019 09:34 AM   Modules accepted: Level of Service

## 2020-01-11 ENCOUNTER — Ambulatory Visit: Payer: Self-pay

## 2020-01-18 ENCOUNTER — Ambulatory Visit: Payer: Self-pay | Attending: Internal Medicine

## 2020-01-18 DIAGNOSIS — Z23 Encounter for immunization: Secondary | ICD-10-CM

## 2020-01-18 NOTE — Progress Notes (Signed)
   Covid-19 Vaccination Clinic  Name:  Treazure Nery    MRN: [id number] DOB: [birth year]  01/18/2020  Ms. Clorene Nerio was observed post Covid-19 immunization for 15 minutes without incident. She was provided with Vaccine Information Sheet and instruction to access the V-Safe system.   Ms. Tamra Koos was instructed to call 911 with any severe reactions post vaccine: Marland Kitchen Difficulty breathing  . Swelling of face and throat  . A fast heartbeat  . A bad rash all over body  . Dizziness and weakness   Immunizations Administered    Name Date Dose VIS Date Route   Pfizer COVID-19 Vaccine 01/18/2020  4:14 PM 0.3 mL 06/10/2018 Intramuscular   Manufacturer: [employer], [employer]   Lot: [id number]   NDC: [address]-3

## 2020-02-08 ENCOUNTER — Ambulatory Visit: Payer: Self-pay | Attending: Internal Medicine

## 2020-02-08 DIAGNOSIS — Z23 Encounter for immunization: Secondary | ICD-10-CM

## 2020-02-08 NOTE — Progress Notes (Signed)
   Covid-19 Vaccination Clinic  Name:  Julie Watkins    MRN: [id number] DOB: [birth year]  02/08/2020  Ms. Julie Watkins was observed post Covid-19 immunization for 15 minutes without incident. She was provided with Vaccine Information Sheet and instruction to access the V-Safe system.   Ms. Julie Watkins was instructed to call 911 with any severe reactions post vaccine: Marland Kitchen Difficulty breathing  . Swelling of face and throat  . A fast heartbeat  . A bad rash all over body  . Dizziness and weakness   Immunizations Administered    Name Date Dose VIS Date Route   Pfizer COVID-19 Vaccine 02/08/2020  3:53 PM 0.3 mL 06/10/2018 Intramuscular   Manufacturer: [employer], [employer]   Lot: [id number]   NDC: [address]-9

## 2023-04-23 ENCOUNTER — Emergency Department
Admission: EM | Admit: 2023-04-23 | Discharge: 2023-04-23 | Disposition: A | Discharge status: Home / Self Care | Payer: Self-pay | Attending: Student in an Organized Health Care Education/Training Program | Admitting: Student in an Organized Health Care Education/Training Program

## 2023-04-23 ENCOUNTER — Emergency Department: Payer: Self-pay

## 2023-04-23 ENCOUNTER — Other Ambulatory Visit: Payer: Self-pay

## 2023-04-23 DIAGNOSIS — R1031 Right lower quadrant pain: Secondary | ICD-10-CM | POA: Insufficient documentation

## 2023-04-23 DIAGNOSIS — R112 Nausea with vomiting, unspecified: Secondary | ICD-10-CM | POA: Insufficient documentation

## 2023-04-23 DIAGNOSIS — R197 Diarrhea, unspecified: Secondary | ICD-10-CM | POA: Insufficient documentation

## 2023-04-23 LAB — COMPREHENSIVE METABOLIC PANEL
ALT: 22 U/L (ref 0–44)
AST: 22 U/L (ref 15–41)
Albumin: 4.6 g/dL (ref 3.5–5.0)
Alkaline Phosphatase: 79 U/L (ref 38–126)
Anion gap: 12 (ref 5–15)
BUN: 19 mg/dL (ref 6–20)
CO2: 21 mmol/L — ABNORMAL LOW (ref 22–32)
Calcium: 8.8 mg/dL — ABNORMAL LOW (ref 8.9–10.3)
Chloride: 103 mmol/L (ref 98–111)
Creatinine, Ser: 0.61 mg/dL (ref 0.44–1.00)
GFR, Estimated: >60 mL/min (ref >60)
Glucose, Bld: 99 mg/dL (ref 70–99)
Potassium: 3.4 mmol/L — ABNORMAL LOW (ref 3.5–5.1)
Sodium: 136 mmol/L (ref 135–145)
Total Bilirubin: 0.7 mg/dL (ref 0.0–1.2)
Total Protein: 8.4 g/dL — ABNORMAL HIGH (ref 6.5–8.1)

## 2023-04-23 LAB — URINALYSIS, ROUTINE W REFLEX MICROSCOPIC
Bacteria, UA: NONE SEEN
Bilirubin Urine: NEGATIVE
Glucose, UA: NEGATIVE mg/dL
Hgb urine dipstick: NEGATIVE
Ketones, ur: 20 mg/dL — AB
Leukocytes,Ua: NEGATIVE
Nitrite: NEGATIVE
Protein, ur: 30 mg/dL — AB
Specific Gravity, Urine: 1.03 (ref 1.005–1.030)
pH: 5 (ref 5.0–8.0)

## 2023-04-23 LAB — CBC
HCT: 41.6 % (ref 36.0–46.0)
Hemoglobin: 13.8 g/dL (ref 12.0–15.0)
MCH: 29.9 pg (ref 26.0–34.0)
MCHC: 33.2 g/dL (ref 30.0–36.0)
MCV: 90.2 fL (ref 80.0–100.0)
Platelets: 279 10*3/uL (ref 150–400)
RBC: 4.61 MIL/uL (ref 3.87–5.11)
RDW: 11.9 % (ref 11.5–15.5)
WBC: 9.3 10*3/uL (ref 4.0–10.5)
nRBC: 0 % (ref 0.0–0.2)

## 2023-04-23 LAB — LIPASE, BLOOD: Lipase: 22 U/L (ref 11–51)

## 2023-04-23 LAB — POC URINE PREG, ED: Preg Test, Ur: NEGATIVE

## 2023-04-23 MED ORDER — ONDANSETRON 4 MG PO TBDP: 4.0000 mg | ORAL_TABLET | Freq: Three times a day (TID) | ORAL | 0 refills | Status: DC | PRN

## 2023-04-23 MED ORDER — ONDANSETRON HCL 4 MG/2ML IJ SOLN
4.0000 mg | Freq: Once | INTRAMUSCULAR | Status: AC
  Administered 2023-04-23: 4 mg via INTRAVENOUS
  Filled 2023-04-23: qty 2

## 2023-04-23 MED ORDER — IBUPROFEN 600 MG PO TABS
600.0000 mg | ORAL_TABLET | Freq: Once | ORAL | Status: AC
Start: 2023-04-23 — End: 2023-04-23
  Administered 2023-04-23: 600 mg via ORAL
  Filled 2023-04-23: qty 1

## 2023-04-23 MED ORDER — ONDANSETRON 4 MG PO TBDP
4.0000 mg | ORAL_TABLET | Freq: Three times a day (TID) | ORAL | 0 refills | Status: AC | PRN
Start: 2023-04-23 — End: ?

## 2023-04-23 MED ORDER — MORPHINE SULFATE (PF) 4 MG/ML IV SOLN: 4.0000 mg | INTRAVENOUS | Status: DC | PRN

## 2023-04-23 MED ORDER — IOHEXOL 300 MG/ML  SOLN
100.0000 mL | Freq: Once | INTRAMUSCULAR | Status: AC | PRN
  Administered 2023-04-23: 100 mL via INTRAVENOUS

## 2023-04-23 MED ORDER — SODIUM CHLORIDE 0.9 % IV BOLUS
1000.0000 mL | Freq: Once | INTRAVENOUS | Status: AC
  Administered 2023-04-23: 1000 mL via INTRAVENOUS

## 2023-04-23 NOTE — ED Triage Notes (Signed)
 Pt to ED for abd pain, n/v/d started Saturday. Denies fevers.

## 2023-04-23 NOTE — ED Provider Notes (Signed)
 [facility] Provider Note    Event Date/Time   First MD Initiated Contact with Patient 04/23/23 1811     (approximate)   History   Abdominal Pain   HPI  Julie Watkins is a 28 y.o. female without recent antibiotic use presents to the ER for evaluation of several days of nausea vomiting diarrhea fevers now with developing right lower quadrant abdominal pain.  Went to walk-in clinic was sent to the ER for further evaluation rule out appendicitis.  Denies any vaginal discharge or bleeding.  No dysuria.  Does have family members that are sick.  States the pain is mild to moderate not able to keep anything down.     Physical Exam   Triage Vital Signs: ED Triage Vitals  Encounter Vitals Group     BP 04/23/23 1410 116/78     Systolic BP Percentile --      Diastolic BP Percentile --      Pulse Rate 04/23/23 1410 (!) 128     Resp 04/23/23 1410 18     Temp 04/23/23 1410 98.7 F (37.1 C)     Temp Source 04/23/23 1756 Oral     SpO2 04/23/23 1410 98 %     Weight 04/23/23 1410 165 lb (74.8 kg)     Height 04/23/23 1410 5' 5 (1.651 m)     Head Circumference --      Peak Flow --      Pain Score 04/23/23 1410 8     Pain Loc --      Pain Education --      Exclude from Growth Chart --     Most recent vital signs: Vitals:   04/23/23 1410 04/23/23 1756  BP: 116/78 115/70  Pulse: (!) 128 (!) 118  Resp: 18 18  Temp: 98.7 F (37.1 C) (!) 100.6 F (38.1 C)  SpO2: 98% 98%     Constitutional: Alert  Eyes: Conjunctivae are normal.  Head: Atraumatic. Nose: No congestion/rhinnorhea. Mouth/Throat: Mucous membranes are moist.   Neck: Painless ROM.  Cardiovascular:   Good peripheral circulation. Respiratory: Normal respiratory effort.  No retractions.  Gastrointestinal: Soft with mild tenderness to palpation the right lower quadrant.  No guarding or rebound. Musculoskeletal:  no deformity Neurologic:  MAE spontaneously. No gross focal neurologic deficits  are appreciated.  Skin:  Skin is warm, dry and intact. No rash noted. Psychiatric: Mood and affect are normal. Speech and behavior are normal.    ED Results / Procedures / Treatments   Labs (all labs ordered are listed, but only abnormal results are displayed) Labs Reviewed  COMPREHENSIVE METABOLIC PANEL - Abnormal; Notable for the following components:      Result Value   Potassium 3.4 (*)    CO2 21 (*)    Calcium 8.8 (*)    Total Protein 8.4 (*)    All other components within normal limits  URINALYSIS, ROUTINE W REFLEX MICROSCOPIC - Abnormal; Notable for the following components:   Color, Urine AMBER (*)    APPearance HAZY (*)    Ketones, ur 20 (*)    Protein, ur 30 (*)    All other components within normal limits  LIPASE, BLOOD  CBC  POC URINE PREG, ED     EKG     RADIOLOGY Please see ED Course for my review and interpretation.  I personally reviewed all radiographic images ordered to evaluate for the above acute complaints and reviewed radiology reports and findings.  These  findings were personally discussed with the patient.  Please see medical record for radiology report.    PROCEDURES:  Critical Care performed: No  Procedures   MEDICATIONS ORDERED IN ED: Medications  morphine  (PF) 4 MG/ML injection 4 mg (has no administration in time range)  ondansetron  (ZOFRAN ) injection 4 mg (4 mg Intravenous Given 04/23/23 1838)  sodium chloride  0.9 % bolus 1,000 mL (1,000 mLs Intravenous New Bag/Given 04/23/23 1835)  iohexol  (OMNIPAQUE ) 300 MG/ML solution 100 mL (100 mLs Intravenous Contrast Given 04/23/23 1851)  ibuprofen  (ADVIL ) tablet 600 mg (600 mg Oral Given 04/23/23 1927)     IMPRESSION / MDM / ASSESSMENT AND PLAN / ED COURSE  I reviewed the triage vital signs and the nursing notes.                              Differential diagnosis includes, but is not limited to, enteritis, gastritis, biliary pathology, pancreatitis, appendicitis, colitis, IBD, dehydration  UTI,  Patient presenting to the ER for evaluation of symptoms as described above.  Based on symptoms, risk factors and considered above differential, this presenting complaint could reflect a potentially life-threatening illness therefore the patient will be placed on continuous pulse oximetry and telemetry for monitoring.  Laboratory evaluation will be sent to evaluate for the above complaints.  CT imaging will be ordered for the blood differential.    Clinical Course as of 04/23/23 1959  Tue Apr 23, 2023  1958 CT imaging on my review and interpretation without evidence of obstruction.  No acute findings per radiology.  Patient feeling improved.  She is tolerating p.o.  Does appear stable and appropriate for outpatient follow-up. [PR]    Clinical Course User Index [PR] Lang Dover, MD     FINAL CLINICAL IMPRESSION(S) / ED DIAGNOSES   Final diagnoses:  Nausea vomiting and diarrhea  Right lower quadrant abdominal pain     Rx / DC Orders   ED Discharge Orders          Ordered    ondansetron  (ZOFRAN -ODT) 4 MG disintegrating tablet  Every 8 hours PRN,   Status:  Discontinued        04/23/23 1959    ondansetron  (ZOFRAN -ODT) 4 MG disintegrating tablet  Every 8 hours PRN        04/23/23 1959             Note:  This document was prepared using Dragon voice recognition software and may include unintentional dictation errors.    Lang Dover, MD 04/23/23 [place]

## 2023-04-23 NOTE — ED Triage Notes (Signed)
 First Nurse Note: Patient to ED from [facility] for RLQ abd pain x3 days. Unable to keep anything down for the past 2 days.

## 2023-04-23 NOTE — ED Provider Triage Note (Signed)
 Emergency Medicine Provider Triage Evaluation Note  Julie Watkins , a 28 y.o. female  was evaluated in triage.  Pt complains of nausea, vomiting, and diarrhea for the past 3 days. No fever, cough, sore throat.  Physical Exam  BP 116/78   Pulse (!) 128   Temp 98.7 F (37.1 C)   Resp 18   Ht 5' 5 (1.651 m)   Wt 74.8 kg   SpO2 98%   BMI 27.46 kg/m  Gen:   Awake, no distress   Resp:  Normal effort  MSK:   Moves extremities without difficulty  Other:    Medical Decision Making  Medically screening exam initiated at 2:13 PM.  Appropriate orders placed.  Julie Watkins was informed that the remainder of the evaluation will be completed by another provider, this initial triage assessment does not replace that evaluation, and the importance of remaining in the ED until their evaluation is complete.    Herlinda Kirk NOVAK, FNP 04/23/23 1413
